# Patient Record
Sex: Female | Born: 2000 | Race: White | Hispanic: No | Marital: Single | Smoking: Never smoker
Health system: Southern US, Community
[De-identification: ages and names within clinical notes are randomized; demographics above are authoritative.]

## PROBLEM LIST (undated history)

## (undated) ENCOUNTER — Inpatient Hospital Stay (HOSPITAL_COMMUNITY): Payer: Self-pay

## (undated) DIAGNOSIS — Z789 Other specified health status: Secondary | ICD-10-CM

## (undated) DIAGNOSIS — J302 Other seasonal allergic rhinitis: Secondary | ICD-10-CM

## (undated) HISTORY — PX: NO PAST SURGERIES: SHX2092

## (undated) HISTORY — DX: Other specified health status: Z78.9

---
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
## 2022-01-22 ENCOUNTER — Other Ambulatory Visit: Payer: Self-pay | Admitting: Obstetrics & Gynecology

## 2022-01-22 DIAGNOSIS — O3680X0 Pregnancy with inconclusive fetal viability, not applicable or unspecified: Secondary | ICD-10-CM

## 2022-01-23 ENCOUNTER — Ambulatory Visit (INDEPENDENT_AMBULATORY_CARE_PROVIDER_SITE_OTHER): Payer: Medicaid Other

## 2022-01-23 ENCOUNTER — Other Ambulatory Visit: Payer: Self-pay

## 2022-01-23 DIAGNOSIS — O3680X0 Pregnancy with inconclusive fetal viability, not applicable or unspecified: Secondary | ICD-10-CM | POA: Diagnosis not present

## 2022-01-23 NOTE — Progress Notes (Signed)
[place] 6+3 wks,single IUP with YS,normal ovaries,CRL 5.13 mm ?

## 2022-03-04 ENCOUNTER — Other Ambulatory Visit: Payer: Self-pay | Admitting: Obstetrics & Gynecology

## 2022-03-04 DIAGNOSIS — Z3682 Encounter for antenatal screening for nuchal translucency: Secondary | ICD-10-CM

## 2022-03-06 ENCOUNTER — Encounter: Payer: Self-pay | Admitting: Obstetrics & Gynecology

## 2022-03-06 ENCOUNTER — Ambulatory Visit (INDEPENDENT_AMBULATORY_CARE_PROVIDER_SITE_OTHER): Payer: Medicaid Other | Admitting: Obstetrics & Gynecology

## 2022-03-06 ENCOUNTER — Ambulatory Visit: Payer: Medicaid Other | Admitting: *Deleted

## 2022-03-06 ENCOUNTER — Ambulatory Visit (INDEPENDENT_AMBULATORY_CARE_PROVIDER_SITE_OTHER): Payer: Medicaid Other

## 2022-03-06 ENCOUNTER — Other Ambulatory Visit (HOSPITAL_COMMUNITY)
Admission: RE | Admit: 2022-03-06 | Discharge: 2022-03-06 | Disposition: A | Discharge status: Home / Self Care | Payer: Medicaid Other | Source: Ambulatory Visit | Attending: Obstetrics & Gynecology | Admitting: Obstetrics & Gynecology

## 2022-03-06 VITALS — BP 125/79 | HR 78 | Wt 207.0 lb

## 2022-03-06 DIAGNOSIS — Z3480 Encounter for supervision of other normal pregnancy, unspecified trimester: Secondary | ICD-10-CM | POA: Diagnosis present

## 2022-03-06 DIAGNOSIS — Z124 Encounter for screening for malignant neoplasm of cervix: Secondary | ICD-10-CM

## 2022-03-06 DIAGNOSIS — Z3A12 12 weeks gestation of pregnancy: Secondary | ICD-10-CM

## 2022-03-06 DIAGNOSIS — Z113 Encounter for screening for infections with a predominantly sexual mode of transmission: Secondary | ICD-10-CM

## 2022-03-06 DIAGNOSIS — Z3682 Encounter for antenatal screening for nuchal translucency: Secondary | ICD-10-CM

## 2022-03-06 DIAGNOSIS — Z3490 Encounter for supervision of normal pregnancy, unspecified, unspecified trimester: Secondary | ICD-10-CM | POA: Insufficient documentation

## 2022-03-06 LAB — POCT URINALYSIS DIPSTICK OB
Blood, UA: NEGATIVE
Glucose, UA: NEGATIVE
Ketones, UA: NEGATIVE
Leukocytes, UA: NEGATIVE
Nitrite, UA: NEGATIVE
POC,PROTEIN,UA: NEGATIVE

## 2022-03-06 MED ORDER — BLOOD PRESSURE MONITOR MISC: 0 refills | Status: AC

## 2022-03-06 NOTE — Progress Notes (Signed)
? ?INITIAL OBSTETRICAL VISIT ?Patient name: Julie Jacobs MRN [id number]  Date of birth: 2001-05-31 ?Chief Complaint:   ?Initial Prenatal Visit ? ?History of Present Illness:   ?Julie Jacobs is a 21 y.o. G25P1001 Caucasian female at [redacted]w[redacted]d by LMP c/w u/s at 6 weeks with an Estimated Date of Delivery: 09/15/22 being seen today for her initial obstetrical visit.   ?Her obstetrical history is significant for: ? ?-prior VAVD, 8#6oz (3810g)- indication for vacuum unclear ? ?Today she reports no complaints. Some mild nausea, no vomiting. No medication. ? ? ?  03/06/2022  ?  8:45 AM 10/21/2017  ?  3:29 PM  ?Depression screen PHQ 2/9  ?Decreased Interest 3 3  ?Down, Depressed, Hopeless 0 0  ?PHQ - 2 Score 3 3  ?Altered sleeping 0 0  ?Tired, decreased energy 0 1  ?Change in appetite 0 3  ?Feeling bad or failure about yourself  0 0  ?Trouble concentrating 0 0  ?Moving slowly or fidgety/restless 0 0  ?Suicidal thoughts 0 0  ?PHQ-9 Score 3 7  ?Difficult doing work/chores  Somewhat difficult  ? ? ?No LMP recorded. Patient is pregnant. ?Last pap to be collected today ? ?Review of Systems:   ?Pertinent items are noted in HPI ?Denies cramping/contractions, leakage of fluid, vaginal bleeding, abnormal vaginal discharge w/ itching/odor/irritation, headaches, visual changes, shortness of breath, chest pain, abdominal pain, severe nausea/vomiting, or problems with urination or bowel movements unless otherwise stated above.  ?Pertinent History Reviewed:  ?Reviewed past medical,surgical, social, obstetrical and family history.  ?Reviewed problem list, medications and allergies. ?OB History  ?Gravida Para Term Preterm AB Living  ?2 1 1     1   ?SAB IAB Ectopic Multiple Live Births  ?      0 1  ?  ?# Outcome Date GA Lbr Len/2nd Weight Sex Delivery Anes PTL Lv  ?2 Current           ?1 Term 05/02/18 [redacted]w[redacted]d 15:30 / 01:49 8 lb 6.4 oz (3.81 kg) M Vag-Vacuum EPI N LIV  ? ?Physical Assessment:  ? ?Vitals:  ? 03/06/22 0837  ?BP: 125/79  ?Pulse: 78   ?Weight: 207 lb (93.9 kg)  ?There is no height or weight on file to calculate BMI. ? ?     Physical Examination: ? General appearance - well appearing, and in no distress ? Mental status - alert, oriented to person, place, and time ? Psych:  She has a normal mood and affect ? Skin - warm and dry, normal color, no suspicious lesions noted ? Chest - effort normal, all lung fields clear to auscultation bilaterally ? Heart - normal rate and regular rhythm ? Abdomen - soft, nontender ? Extremities:  No swelling or varicosities noted ? Pelvic - VULVA: normal appearing vulva with no masses, tenderness or lesions  VAGINA: normal appearing vagina with normal color and discharge, no lesions  CERVIX: normal appearing cervix without discharge or lesions, no CMT ? Thin prep pap is done with HR HPV cotesting ? ?Chaperone: 05/06/22 Neas   ? ?TODAY'S NT - normal ?Lawanna Kobus 12+3 wks,measurements c/w dates,CRL 60.23 mm,NB present,NT 1.3 mm,posterior placenta,normal ovaries,FHR 160 bpm ? ?No results found for this or any previous visit (from the past 24 hour(s)).  ?Assessment & Plan:  ?1) Low-Risk Pregnancy G2P1001 at [redacted]w[redacted]d with an Estimated Date of Delivery: 09/15/22  ? ?2) Initial OB visit ? ? ?Meds:  ?Meds ordered this encounter  ?Medications  ? Blood Pressure Monitor MISC  ?  Sig: For regular home bp monitoring during pregnancy  ?  Dispense:  1 each  ?  Refill:  0  ?  Z34.81 ?Please mail to patient  ? ? ?Initial labs obtained ?Continue prenatal vitamins ?Reviewed n/v relief measures and warning s/s to report ?Reviewed recommended weight gain based on pre-gravid BMI ?Encouraged well-balanced diet ?Genetic & carrier screening discussed: requests Panorama and NT/IT,  ?Ultrasound discussed; fetal survey: results reviewed ?CCNC completed> form faxed if has or is planning to apply for medicaid ?The nature of [facility] with multiple MDs and other Advanced Practice Providers was explained to patient; also  emphasized that fellows, residents, and students are part of our team. ?Pt does not have home bp cuff. Rx given to patient ? ?Indications for ASA therapy (per uptodate) ?One of the following: ?H/O preeclampsia, especially early onset/adverse outcome No ?Multifetal gestation No ?CHTN No ?T1DM or T2DM No ?Chronic kidney disease No ?Autoimmune disease (antiphospholipid syndrome, systemic lupus erythematosus) No ? ?OR Two or more of the following: ?Nulliparity No ?Obesity (BMI>30 kg/m2) Yes ?Family h/o preeclampsia in mother or sister No ?Age ?35 years No ?Sociodemographic characteristics (African American race, low socioeconomic level) No ?Personal risk factors (eg, previous pregnancy w/ LBW or SGA, previous adverse pregnancy outcome [eg, stillbirth], interval >10 years between pregnancies) No ? ?Indications for early A1C (per uptodate) ?BMI >=25 (>=23 in Asian women) AND one of the following ?GDM in a previous pregnancy No ?Previous A1C?5.7, impaired glucose tolerance, or impaired fasting glucose on previous testing No ?First-degree relative with diabetes No ?High-risk race/ethnicity (eg, African American, Latino, Native American, [place] American, [place] Islander) No ?History of cardiovascular disease No ?HTN or on therapy for hypertension No ?HDL cholesterol level <35 mg/dL (8.08 mmol/L) and/or a triglyceride level >250 mg/dL (8.11 mmol/L) No ?PCOS No ?Physical inactivity Yes ?Other clinical condition associated with insulin resistance (eg, severe obesity, acanthosis nigricans) No ?Previous birth of an infant weighing ?4000 g No - 3810g ?Previous stillbirth of unknown cause No ?>= 40yo No ? ?Follow-up: No follow-ups on file.  ? ?Orders Placed This Encounter  ?Procedures  ? Urine Culture  ? Integrated 1  ? Genetic Screening  ? CBC/D/Plt+RPR+Rh+ABO+RubIgG...  ? Hemoglobin A1c  ? POC Urinalysis Dipstick OB  ? ?

## 2022-03-06 NOTE — Progress Notes (Signed)
[place] 12+3 wks,measurements c/w dates,CRL 60.23 mm,NB present,NT 1.3 mm,posterior placenta,normal ovaries,FHR 160 bpm ?

## 2022-03-07 LAB — CYTOLOGY - PAP
Chlamydia: NEGATIVE
Comment: NEGATIVE
Comment: NORMAL
Diagnosis: NEGATIVE
Neisseria Gonorrhea: NEGATIVE

## 2022-03-08 LAB — CBC/D/PLT+RPR+RH+ABO+RUBIGG...
Antibody Screen: NEGATIVE
Basophils Absolute: 0 10*3/uL (ref 0.0–0.2)
Basos: 1 %
EOS (ABSOLUTE): 0.1 10*3/uL (ref 0.0–0.4)
Eos: 1 %
HCV Ab: NONREACTIVE
HIV Screen 4th Generation wRfx: NONREACTIVE
Hematocrit: 41.5 % (ref 34.0–46.6)
Hemoglobin: 14.3 g/dL (ref 11.1–15.9)
Hepatitis B Surface Ag: NEGATIVE
Immature Grans (Abs): 0 10*3/uL (ref 0.0–0.1)
Immature Granulocytes: 0 %
Lymphocytes Absolute: 1.6 10*3/uL (ref 0.7–3.1)
Lymphs: 20 %
MCH: 31.2 pg (ref 26.6–33.0)
MCHC: 34.5 g/dL (ref 31.5–35.7)
MCV: 90 fL (ref 79–97)
Monocytes Absolute: 0.5 10*3/uL (ref 0.1–0.9)
Monocytes: 6 %
Neutrophils Absolute: 6.1 10*3/uL (ref 1.4–7.0)
Neutrophils: 72 %
Platelets: 265 10*3/uL (ref 150–450)
RBC: 4.59 x10E6/uL (ref 3.77–5.28)
RDW: 13.1 % (ref 11.7–15.4)
RPR Ser Ql: NONREACTIVE
Rh Factor: POSITIVE
Rubella Antibodies, IGG: 1.25 index (ref >0.99)
WBC: 8.4 10*3/uL (ref 3.4–10.8)

## 2022-03-08 LAB — INTEGRATED 1
Crown Rump Length: 60.2 mm
Gest. Age on Collection Date: 12.3 weeks
Maternal Age at EDD: 21.7 yr
Nuchal Translucency (NT): 1.3 mm
Number of Fetuses: 1
PAPP-A Value: 722.8 ng/mL
Weight: 207 [lb_av]

## 2022-03-08 LAB — HCV INTERPRETATION

## 2022-03-08 LAB — HEMOGLOBIN A1C
Est. average glucose Bld gHb Est-mCnc: 85 mg/dL
Hgb A1c MFr Bld: 4.6 % — ABNORMAL LOW (ref 4.8–5.6)

## 2022-03-10 ENCOUNTER — Other Ambulatory Visit: Payer: Self-pay | Admitting: Obstetrics & Gynecology

## 2022-03-10 DIAGNOSIS — O2340 Unspecified infection of urinary tract in pregnancy, unspecified trimester: Secondary | ICD-10-CM

## 2022-03-10 LAB — URINE CULTURE

## 2022-03-10 MED ORDER — AMOXICILLIN-POT CLAVULANATE 875-125 MG PO TABS: 1.0000 | ORAL_TABLET | Freq: Two times a day (BID) | ORAL | 0 refills | Status: AC

## 2022-03-10 NOTE — Progress Notes (Signed)
Rx sent in for UTI.

## 2022-03-17 ENCOUNTER — Encounter: Payer: Self-pay | Admitting: Obstetrics & Gynecology

## 2022-03-18 ENCOUNTER — Encounter: Payer: Self-pay | Admitting: Obstetrics & Gynecology

## 2022-03-21 ENCOUNTER — Encounter: Payer: Self-pay | Admitting: *Deleted

## 2022-04-03 ENCOUNTER — Encounter: Payer: Medicaid - Out of State | Admitting: Advanced Practice Midwife
